# Patient Record
Sex: Male | Born: 2000 | Race: White | Hispanic: No | Marital: Single | Smoking: Never smoker
Health system: Southern US, Community
[De-identification: ages and names within clinical notes are randomized; demographics above are authoritative.]

## PROBLEM LIST (undated history)

## (undated) DIAGNOSIS — F319 Bipolar disorder, unspecified: Secondary | ICD-10-CM

---
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
## 2022-09-01 ENCOUNTER — Telehealth: Payer: Self-pay | Admitting: Physician Assistant

## 2022-09-01 NOTE — Telephone Encounter (Signed)
 Called patient to schedule next available cpe. No answer left VM
